# Patient Record
Sex: Male | Born: 1990 | Race: Black or African American | Hispanic: No | Marital: Single | Smoking: Never smoker
Health system: Southern US, Community
[De-identification: ages and names within clinical notes are randomized; demographics above are authoritative.]

---
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
## 2008-07-10 ENCOUNTER — Ambulatory Visit: Payer: Self-pay | Admitting: Family Medicine

## 2009-01-14 ENCOUNTER — Ambulatory Visit: Payer: Self-pay | Admitting: Family Medicine

## 2010-12-06 NOTE — Assessment & Plan Note (Signed)
Summary: cpe/eo   Vital Signs:  Patient Profile:   20 Years Old Male Height:     68 inches (172.72 cm) Weight:      137.5 pounds (62.50 kg) BMI:     20.98 Temp:     98.2 degrees F (36.78 degrees C) oral Pulse rate:   70 / minute BP sitting:   134 / 70  (left arm) Cuff size:   regular  Pt. in pain?   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 He is living in the dorm with 1 roommate. He says they get along well. He does see his mom often as she lives in the area. He is very active; going to the gym and running often. He is enjoying his schooling. His girlfriend moved away. He is not sexually active. He denies drinking, smoking and drugs. He did need some paperwork filled out for his school and for the state of [place]. His mother is going to become a [employer] for children and he is going to be visiting it so the state requires that he have a health exam. This paperwork was filled out except the TB test because he can not come back in 3 days to get it read. He has agreed to find out if his school can place a PPD test and if not, to come back next week.  He has no health issues at this time and no concerns or questions.     Past Medical History:    childhood asthma  Past Surgical History:    none   Family History:    Mgma-diabetes, Pgma-diabetes,HTN  Social History:    Lives with mom, sister at college, no pets    Ran indoor track, goes to the [employer] and runs.    Risk Factors:  Tobacco use:  never   Review of Systems       neg except as noted in HPI   Physical  Exam  General:     Well-developed,well-nourished,in no acute distress; alert,appropriate and cooperative throughout examination Head:     Normocephalic and atraumatic without obvious abnormalities. No apparent alopecia or balding. Eyes:     No corneal or conjunctival inflammation noted. EOMI. Perrla. Funduscopic exam benign, without hemorrhages, exudates or papilledema. Vision grossly normal. Pt is wearing one contact because the other one was irritating him.  Ears:     External ear exam shows no significant lesions or deformities.  Otoscopic examination reveals clear canals, tympanic membranes are intact bilaterally without bulging, retraction, inflammation or discharge. Hearing is grossly normal bilaterally. Nose:     External nasal examination shows no deformity or inflammation. Nasal mucosa are pink and moist without lesions or exudates. Mouth:     Oral mucosa and oropharynx without lesions or exudates.  one tooth is missing. plaque is present. advised to see dentist. Neck:     No deformities, masses, or tenderness noted. Chest Wall:     No deformities, masses,  tenderness or gynecomastia noted. Lungs:     Normal respiratory effort, chest expands symmetrically. Lungs are clear to auscultation, no crackles or wheezes. Heart:     Normal rate and regular rhythm. S1 and S2 normal without gallop, murmur, click, rub or other extra sounds. Abdomen:     Bowel sounds positive,abdomen soft and non-tender without masses, organomegaly or hernias noted. Genitalia:     Testes bilaterally descended without nodularity, tenderness or masses. No scrotal masses or lesions. No penis lesions or urethral discharge. Msk:     No deformity or scoliosis noted of thoracic or lumbar spine.  no muscle atrophy.  excellent strength in all extremeties.  Extremities:     No clubbing, cyanosis, edema, or deformity noted with normal full range of motion of all joints.   Neurologic:     No cranial nerve deficits  noted. Station and gait are normal. DTRs are symmetrical throughout. Sensory, motor and coordinative functions appear intact. Skin:     Intact without suspicious lesions or rashes Psych:     Cognition and judgment appear intact. Alert and cooperative with normal attention span and concentration. No apparent delusions, illusions, hallucinations    Impression & Recommendations:  Problem # 1:  PHYSICAL EXAMINATION, NORMAL (ICD-V70.0) Assessment: Unchanged I spent >30 mintues of face to face time with the patient. The patient had two health forms to fill out for school and for the state.  He is going to try to get a PPD placed at his school. If they will not place it, he is advised to return to our clinic and get a PPD placed so that he can have all his paperwork filled out.  His health is great and his physical exam showed a healthy, 20 yo male with no health problems.   Orders: [facility]- Est  Level 4 (16109)    Patient Instructions: 1)  You had a complete physical today. It was completely normal. 2)  You appear to be in very good health. Continue to abstain from alcohol, drugs and smoking. Continue to exercise at the gym. Continue to eat as healthfully as possible (1/2 plate vegetables, 1/4 plate protein, 1/4 plate carbs).  3)  Your paper is mostly filled out. Please look into getting a PPD test placed at your school's health clinic. If you can't, come back next week and we will place it on Tuesday.  4)    5)  Please schedule a follow-up appointment as needed.   ]  Vital Signs:  Patient Profile:   20 Years Old Male Height:     68 inches (172.72 cm) Weight:      137.5 pounds (62.50 kg) BMI:     20.98 Temp:     98.2 degrees F (36.78 degrees C) oral Pulse rate:   70 / minute BP sitting:   134 / 70 Cuff size:   regular              Vision Screening: Right Eye w/o correction: 20 / 100 Left eye with correction: 20 / 25 Both eyes with correction: 20 / 25

## 2010-12-06 NOTE — Assessment & Plan Note (Signed)
Summary: knot on chest,df   Vital Signs:  Patient profile:   20 year old male Height:      68 inches Weight:      137.3 pounds BMI:     20.95 Temp:     97.3 degrees F oral Pulse rate:   79 / minute BP sitting:   122 / 85  (left arm) Cuff size:   regular  Vitals Entered By: Dedra Skeens CMA, (January 14, 2009 1:41 PM) CC: chest/breast lump Is Patient Diabetic? No Pain Assessment Patient in pain? 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 It was painless. It has regressed over time and is not present at the visit today. He has not had any chest pain and no shortness of breath. He has noticed no enlargement in his axilla.   Habits & Providers     Tobacco Status: never  Review of Systems       see HPI  Physical Exam  General:  Thin, in no acute distress; alert,appropriate and cooperative throughout examination Chest Wall:  no deformities, no tenderness, no masses, and no gynecomastia.  NO axillary lymphnode enlargement.  Breasts:  no lump under Rt nipple. Both chest/breast are symmetric and no masses, adenopathy, or gynecomastia present. no nipple discharge, no tenderness, no inflammation, and no skin changes.   Lungs:  Normal respiratory effort, chest expands symmetrically. Lungs are clear to auscultation, no crackles or wheezes. Heart:  Normal rate and regular rhythm. S1 and S2 normal without gallop, murmur, click, rub or other extra sounds.   Impression & Recommendations:  Problem # 1:  Sx of SWELLING, MASS, OR LUMP IN CHEST (ICD-786.6) Assessment New Pt had no lump or swelling under the Rt nipple when examined. Discussed the possibility of breast cancer in men and that it was wise to come in. Pt encouraged to come in again if mass reappears. Plan to see the pt back when he has any concerns.   Orders: [facility]- Est Level  3 (35009)  Patient  Instructions: 1)  It was good to see you. 2)  I do not feel a lump. I think you are doing well. 3)  If you feel a lump on your chest again, please come back in and I will take a look at it.  4)  Have a great day. 5)  The medication list was reviewed and reconciled.  All changed / newly prescribed medications were explained.  A complete medication list was provided to the patient / caregiver.

## 2015-11-21 ENCOUNTER — Ambulatory Visit (INDEPENDENT_AMBULATORY_CARE_PROVIDER_SITE_OTHER): Payer: BLUE CROSS/BLUE SHIELD | Admitting: Family Medicine

## 2015-11-21 VITALS — BP 120/72 | HR 65 | Temp 98.0°F | Resp 18 | Ht 67.5 in | Wt 159.0 lb

## 2015-11-21 DIAGNOSIS — R21 Rash and other nonspecific skin eruption: Secondary | ICD-10-CM

## 2015-11-21 MED ORDER — CLOTRIMAZOLE-BETAMETHASONE 1-0.05 % EX CREA: 1.0000 "application " | TOPICAL_CREAM | Freq: Two times a day (BID) | CUTANEOUS | Status: AC

## 2015-11-21 MED ORDER — DOXYCYCLINE HYCLATE 100 MG PO TABS: 100.0000 mg | ORAL_TABLET | Freq: Two times a day (BID) | ORAL | Status: DC

## 2015-11-21 NOTE — Progress Notes (Signed)
This chart was scribed for Elvina Sidle, MD by [facility], medical scribe at [facility].The patient was seen in exam room 09 and the patient's care was started at 10:17 AM.  Patient ID: DYLANN GALLIER MRN: [id number], DOB: [birth year], 25 y.o. Date of Encounter: 11/21/2015  Primary Physician: No primary care provider on file.  Chief Complaint:  Chief Complaint  Patient presents with   Rash    face, neck, arms and gluteal area since  monday    HPI:  TOD ABRAHAMSEN is a 25 y.o. male who presents to [facility] because of a rash on his face, neck, buttock and bilateral arms. The rash does itch. He has used benadryl cream for relief. He did change his detergent. Exercises frequently at the gym. No abdominal pain, cough or fever. Works for a [employer].  History reviewed. No pertinent past medical history.   Home Meds: Prior to Admission medications   Medication Sig Start Date End Date Taking? Authorizing Provider  diphenhydrAMINE (BENADRYL) 25 mg capsule Take 25 mg by mouth every 6 (six) hours as needed.   Yes Historical Provider, MD  diphenhydrAMINE-zinc acetate (BENADRYL) cream Apply topically 3 (three) times daily as needed for itching.   Yes Historical Provider, MD   Allergies: No Known Allergies  Social History   Social History   Marital Status: Single    Spouse Name: N/A   Number of Children: N/A   Years of Education: N/A   Occupational History   Not on file.   Social History Main Topics   Smoking status: Never Smoker    Smokeless tobacco: Not on file   Alcohol Use: No   Drug Use: No   Sexual Activity: Not on file   Other Topics Concern   Not on file   Social History Narrative   No narrative on file    Review of Systems: Constitutional: negative for chills, fever, night sweats, weight changes, or fatigue  HEENT: negative for vision changes, hearing loss, congestion, rhinorrhea, ST, epistaxis, or  sinus pressure Cardiovascular: negative for chest pain or palpitations Respiratory: negative for hemoptysis, wheezing, shortness of breath, or cough Abdominal: negative for abdominal pain, nausea, vomiting, diarrhea, or constipation Dermatological: positive for rash. Neurologic: negative for headache, dizziness, or syncope All other systems reviewed and are otherwise negative with the exception to those above and in the HPI.  Physical Exam: Blood pressure 120/72, pulse 65, temperature 98 F (36.7 C), temperature source Oral, resp. rate 18, height 5' 7.5" (1.715 m), weight 159 lb (72.122 kg), SpO2 98 %., Body mass index is 24.52 kg/(m^2). General: Well developed, well nourished, in no acute distress. Head: Normocephalic, atraumatic, eyes without discharge, sclera non-icteric, nares are without discharge. Bilateral auditory canals clear, TM's are without perforation, pearly grey and translucent with reflective cone of light bilaterally. Oral cavity moist, posterior pharynx without exudate, erythema, peritonsillar abscess, or post nasal drip.  Neck: Supple. No thyromegaly. Full ROM. No lymphadenopathy. Lungs: Clear bilaterally to auscultation without wheezes, rales, or rhonchi. Breathing is unlabored. Heart: RRR with S1 S2. No murmurs, rubs, or gallops appreciated. Abdomen: Soft, non-tender, non-distended with normoactive bowel sounds. No hepatomegaly. No rebound/guarding. No obvious abdominal masses. Msk:  Strength and tone normal for age. Extremities/Skin: Warm and dry. No clubbing or cyanosis. No edema. Acne form like rash, multiple fine papules. Patches of hyperpigmented papules on his arms and right buttock. Neuro: Alert and oriented X 3. Moves all extremities spontaneously.  Gait is normal. CNII-XII grossly in tact. Psych:  Responds to questions appropriately with a normal affect.   Labs:  ASSESSMENT AND PLAN:  25 y.o. year old male with   By signing my name below, I, Nadim Abuhashem,  attest that this documentation has been prepared under the direction and in the presence of Elvina SidleKurt Lauenstein, MD.  Electronically Signed: Conchita ParisNadim Abuhashem, medical scribe. 11/21/2015 10:28 AM.  This chart was scribed in my presence and reviewed by me personally.    ICD-9-CM ICD-10-CM   1. Rash and nonspecific skin eruption 782.1 R21 doxycycline (VIBRA-TABS) 100 MG tablet     clotrimazole-betamethasone (LOTRISONE) cream     Signed, Elvina SidleKurt Lauenstein, MD

## 2016-10-06 ENCOUNTER — Ambulatory Visit (INDEPENDENT_AMBULATORY_CARE_PROVIDER_SITE_OTHER): Payer: BLUE CROSS/BLUE SHIELD | Admitting: Physician Assistant

## 2016-10-06 VITALS — BP 120/68 | HR 65 | Temp 98.2°F | Resp 16 | Ht 67.0 in | Wt 140.2 lb

## 2016-10-06 DIAGNOSIS — R21 Rash and other nonspecific skin eruption: Secondary | ICD-10-CM | POA: Diagnosis not present

## 2016-10-06 LAB — POCT SKIN KOH: Skin KOH, POC: NEGATIVE

## 2016-10-06 LAB — GLUCOSE, POCT (MANUAL RESULT ENTRY): POC Glucose: 89 mg/dL (ref 70–99)

## 2016-10-06 MED ORDER — PREDNISONE 20 MG PO TABS: ORAL_TABLET | ORAL | 0 refills | Status: DC

## 2016-10-06 NOTE — Patient Instructions (Addendum)
-  Take prednisone as prescribed.   -We will contact you with lab results.   -Return to clinic if symptoms worsen, do not improve in 2 weeks, or as needed   WOUND CARE . Keep area clean and dry for 24 hours. Do not remove bandage, if applied. . After 24 hours, remove bandage and wash wound gently with mild soap and warm water. Reapply a new bandage after cleaning wound, if directed. . Notify the office if you experience any of the following signs of infection: Swelling, redness, pus drainage, streaking, fever >101.0 F . Notify the office if you experience excessive bleeding that does not stop after 15-20 minutes of constant, firm pressure.

## 2016-10-06 NOTE — Progress Notes (Signed)
Luis AlarGregory D Rod  MRN: [id number] DOB: Mar 25, 1991  Subjective:  Luis Randolph is a 25 y.o. male seen in office today for a chief complaint of rash on neck and bilateral arms x 2 months. Has associated itching. Denies pain or purulent discharge. Pt had a similar rash on bilateral arms in 11/2015 and was given lotrisone cream and doxycycline tablets. Notes the rash completely resolved. Denies any recent change to laundry detergent, lotions, soaps, foods, antibiotics, and plants. He has a history of childhood asthma. No seasonal allergies. No one at home has a similar rash. Has tried a eucerin cream which helped with itching. Pt does go to the gym but has not been recently.    Review of Systems  Constitutional: Negative for chills, diaphoresis, fatigue and fever.  Gastrointestinal: Negative for abdominal pain, blood in stool, diarrhea, nausea and vomiting.  Musculoskeletal: Negative for arthralgias, joint swelling and myalgias.    Patient Active Problem List   Diagnosis Date Noted  . 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 clotrimazole-betamethasone (LOTRISONE) cream Apply 1 application topically 2 (two) times daily. (Patient not taking: Reported on 10/06/2016) 30 g 0  . diphenhydrAMINE (BENADRYL) 25 mg capsule Take 25 mg by mouth every 6 (six) hours as needed.    . diphenhydrAMINE-zinc acetate (BENADRYL) cream Apply topically 3 (three) times daily as needed for itching.     No current facility-administered medications on file prior to visit.     No Known Allergies   Objective:  BP 120/68 (BP Location: Right Arm, Patient Position: Sitting, Cuff Size: Normal)   Pulse 65   Temp 98.2 F (36.8 C) (Oral)   Resp 16   Ht 5\' 7"  (1.702 m)   Wt 140 lb 3.2 oz (63.6 kg)   SpO2 98%   BMI 21.96 kg/m   Physical Exam  Constitutional: He is oriented to person, place, and time and well-developed,  well-nourished, and in no distress.  HENT:  Head: Normocephalic and atraumatic.  Eyes: Conjunctivae are normal.  Neck: Normal range of motion.  Pulmonary/Chest: Effort normal.  Neurological: He is alert and oriented to person, place, and time. Gait normal.  Skin: Skin is warm and dry.  Large hyperpigmented patches with interspersed papules on bilateral neck and left lateral aspect of antecubital fossa.   Psychiatric: Affect normal.  Vitals reviewed.   Results for orders placed or performed in visit on 10/06/16 (from the past 24 hour(s))  POCT Skin KOH     Status: None   Collection Time: 10/06/16  6:40 PM  Result Value Ref Range   Skin KOH, POC Negative   POCT glucose (manual entry)     Status: Normal   Collection Time: 10/06/16  7:23 PM  Result Value Ref Range   POC Glucose 89 70 - 99 mg/dl    Procedure Note:  Area prepped with betadine. Local anesthesia with 1% lidocaine with epinephrine used. Sterile drape applied.  Punch biopsy obtained. #1 horizontal mattress suture placed using 4-0 absorbable Vicryl. Mupiricin ointment and dressing applied. Specimen collected and sent for dermatology pathology.    Assessment and Plan :  1. Rash and nonspecific skin eruption Await results -Wound care instructions given   - POCT Skin KOH - POCT glucose (manual entry) - Dermatology pathology - predniSONE (DELTASONE) 20 MG tablet; Take 3 PO QAM x3days, 2 PO QAM x3days, 1 PO QAM x3days  Dispense: 18 tablet;  Refill: 0 -Return to clinic if symptoms worsen, do not improve in 2 weeks, or as needed   Benjiman CoreBrittany Makeyla Govan PA-C  [facility] 10/06/2016 8:49 PM

## 2017-03-12 ENCOUNTER — Ambulatory Visit (INDEPENDENT_AMBULATORY_CARE_PROVIDER_SITE_OTHER): Payer: BLUE CROSS/BLUE SHIELD | Admitting: Emergency Medicine

## 2017-03-12 VITALS — BP 146/89 | HR 65 | Temp 98.6°F | Resp 18 | Ht 67.0 in | Wt 153.6 lb

## 2017-03-12 DIAGNOSIS — R21 Rash and other nonspecific skin eruption: Secondary | ICD-10-CM | POA: Diagnosis not present

## 2017-03-12 MED ORDER — PREDNISONE 20 MG PO TABS: ORAL_TABLET | ORAL | 0 refills | Status: AC

## 2017-03-12 MED ORDER — TRIAMCINOLONE ACETONIDE 0.1 % EX CREA: 1.0000 "application " | TOPICAL_CREAM | Freq: Two times a day (BID) | CUTANEOUS | 2 refills | Status: AC

## 2017-03-12 NOTE — Progress Notes (Signed)
Luis Randolph 26 y.o.   Chief Complaint  Patient presents with  . Follow-up    on rash which is on both arms and neck    HISTORY OF PRESENT ILLNESS: This is a 26 y.o. male complaining of recurrent rash mostly to neck area and outer elbows. Medications help but rash keeps coming back.  HPI   Prior to Admission medications   Medication Sig Start Date End Date Taking? Authorizing Provider  clotrimazole-betamethasone (LOTRISONE) cream Apply 1 application topically 2 (two) times daily. Patient not taking: Reported on 10/06/2016 11/21/15   Elvina Sidle, MD  diphenhydrAMINE (BENADRYL) 25 mg capsule Take 25 mg by mouth every 6 (six) hours as needed.    [provider]  diphenhydrAMINE-zinc acetate (BENADRYL) cream Apply topically 3 (three) times daily as needed for itching.    [provider]  predniSONE (DELTASONE) 20 MG tablet Take 3 PO QAM x3days, 2 PO QAM x3days, 1 PO QAM x3days Patient not taking: Reported on 03/12/2017 10/06/16   Benjiman Core D, PA-C    No Known Allergies  Patient Active Problem List   Diagnosis Date Noted  . 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  No past surgical history on file.  Social History   Social History  . Marital status: Single    Spouse name: N/A  . Number of children: N/A  . Years of education: N/A   Occupational History  . Not on file.   Social History Main Topics  . Smoking status: Never Smoker  . Smokeless tobacco: Not on file  . Alcohol use No  . Drug use: No  . Sexual activity: Not on file   Other Topics Concern  . Not on file   Social History Narrative  . No narrative on file    Family History  Problem Relation Age of Onset  . Hyperlipidemia Mother      Review of Systems  Constitutional: Negative for chills, fever and weight loss.  HENT: Negative.  Negative for congestion, nosebleeds and sore throat.   Eyes: Negative.  Negative for discharge and  redness.  Respiratory: Negative.  Negative for cough and shortness of breath.   Cardiovascular: Negative.  Negative for chest pain and palpitations.  Gastrointestinal: Negative for abdominal pain, diarrhea, nausea and vomiting.  Genitourinary: Negative for dysuria and hematuria.  Musculoskeletal: Negative.  Negative for back pain, myalgias and neck pain.  Skin: Positive for rash.  Neurological: Negative.  Negative for dizziness and headaches.  Endo/Heme/Allergies: Negative.   All other systems reviewed and are negative.    Vitals:   03/12/17 1716 03/12/17 1748  BP: (!) 153/81 (!) 146/89  Pulse: 65   Resp: 18   Temp: 98.6 F (37 C)      Physical Exam  Constitutional: He is oriented to person, place, and time. He appears well-developed and well-nourished.  HENT:  Head: Normocephalic and atraumatic.  Mouth/Throat: Oropharynx is clear and moist.  Eyes: Conjunctivae and EOM are normal. Pupils are equal, round, and reactive to light.  Neck: Normal range of motion. Neck supple. No JVD present. No thyromegaly present.  Cardiovascular: Normal rate, regular rhythm and normal heart sounds.   Pulmonary/Chest: Effort normal and breath sounds normal.  Abdominal: Soft. Bowel sounds are normal. He exhibits no distension. There is no tenderness.  Musculoskeletal: Normal range of motion.  Lymphadenopathy:    He has no cervical adenopathy.  Neurological: He is alert and oriented to person, place, and  time. No sensory deficit. He exhibits normal muscle tone.  Skin: Skin is warm and dry. Capillary refill takes less than 2 seconds. Rash (eczematous rash to periphery of lower neck and also both outer antecubital areas) noted.  Psychiatric: He has a normal mood and affect. His behavior is normal.  Vitals reviewed.    ASSESSMENT & PLAN: Luis Randolph was seen today for follow-up.  Diagnoses and all orders for this visit:  Rash and nonspecific skin eruption -     CBC with Differential/Platelet -      Comprehensive metabolic panel -     IGE -     Ambulatory referral to Dermatology -     predniSONE (DELTASONE) 20 MG tablet; Take 3 PO QAM x3days, 2 PO QAM x3days, 1 PO QAM x3days  Other orders -     triamcinolone cream (KENALOG) 0.1 %; Apply 1 application topically 2 (two) times daily.    Patient Instructions       IF you received an x-ray today, you will receive an invoice from [facility]. Please contact [facility] at [phone] with questions or concerns regarding your invoice.   IF you received labwork today, you will receive an invoice from [facility]. Please contact [facility] at [phone] with questions or concerns regarding your invoice.   Our billing staff will not be able to assist you with questions regarding bills from these companies.  You will be contacted with the lab results as soon as they are available. The fastest way to get your results is to activate your My Chart account. Instructions are located on the last page of this paperwork. If you have not heard from us regarding the results in 2 weeks, please contact this office.      Contact Dermatitis Dermatitis is redness, soreness, and swelling (inflammation) of the skin. Contact dermatitis is a reaction to certain substances that touch the skin. You either touched something that irritated your skin, or you have allergies to something you touched. Follow these instructions at home: Skin Care   Moisturize your skin as needed.  Apply cool compresses to the affected areas.  Try taking a bath with:  Epsom salts. Follow the instructions on the package. You can get these at a pharmacy or grocery store.  Baking soda. Pour a small amount into the bath as told by your doctor.  Colloidal oatmeal. Follow the instructions on the package. You can get this at a pharmacy or grocery store.  Try applying baking soda paste to your skin. Stir water into baking soda until it looks like paste.  Do not  scratch your skin.  Bathe less often.  Bathe in lukewarm water. Avoid using hot water. Medicines   Take or apply over-the-counter and prescription medicines only as told by your doctor.  If you were prescribed an antibiotic medicine, take or apply your antibiotic as told by your doctor. Do not stop taking the antibiotic even if your condition starts to get better. General instructions   Keep all follow-up visits as told by your doctor. This is important.  Avoid the substance that caused your reaction. If you do not know what caused it, keep a journal to try to track what caused it. Write down:  What you eat.  What cosmetic products you use.  What you drink.  What you wear in the affected area. This includes jewelry.  If you were given a bandage (dressing), take care of it as told by your doctor. This includes when to change and  remove it. Contact a doctor if:  You do not get better with treatment.  Your condition gets worse.  You have signs of infection such as:  Swelling.  Tenderness.  Redness.  Soreness.  Warmth.  You have a fever.  You have new symptoms. Get help right away if:  You have a very bad headache.  You have neck pain.  Your neck is stiff.  You throw up (vomit).  You feel very sleepy.  You see red streaks coming from the affected area.  Your bone or joint underneath the affected area becomes painful after the skin has healed.  The affected area turns darker.  You have trouble breathing. This information is not intended to replace advice given to you by your health care provider. Make sure you discuss any questions you have with your health care provider. Document Released: 08/20/2009 Document Revised: 03/30/2016 Document Reviewed: 03/10/2015 Elsevier Interactive Patient Education  2017 Elsevier Inc.      Edwina Barth, MD [facility]

## 2017-03-12 NOTE — Patient Instructions (Addendum)
     IF you received an x-ray today, you will receive an invoice from [facility]. Please contact [facility] at [phone] with questions or concerns regarding your invoice.   IF you received labwork today, you will receive an invoice from [facility]. Please contact [facility] at [phone] with questions or concerns regarding your invoice.   Our billing staff will not be able to assist you with questions regarding bills from these companies.  You will be contacted with the lab results as soon as they are available. The fastest way to get your results is to activate your My Chart account. Instructions are located on the last page of this paperwork. If you have not heard from us regarding the results in 2 weeks, please contact this office.      Contact Dermatitis Dermatitis is redness, soreness, and swelling (inflammation) of the skin. Contact dermatitis is a reaction to certain substances that touch the skin. You either touched something that irritated your skin, or you have allergies to something you touched. Follow these instructions at home: Skin Care   Moisturize your skin as needed.  Apply cool compresses to the affected areas.  Try taking a bath with:  Epsom salts. Follow the instructions on the package. You can get these at a pharmacy or grocery store.  Baking soda. Pour a small amount into the bath as told by your doctor.  Colloidal oatmeal. Follow the instructions on the package. You can get this at a pharmacy or grocery store.  Try applying baking soda paste to your skin. Stir water into baking soda until it looks like paste.  Do not scratch your skin.  Bathe less often.  Bathe in lukewarm water. Avoid using hot water. Medicines   Take or apply over-the-counter and prescription medicines only as told by your doctor.  If you were prescribed an antibiotic medicine, take or apply your antibiotic as told by your doctor. Do not stop taking the  antibiotic even if your condition starts to get better. General instructions   Keep all follow-up visits as told by your doctor. This is important.  Avoid the substance that caused your reaction. If you do not know what caused it, keep a journal to try to track what caused it. Write down:  What you eat.  What cosmetic products you use.  What you drink.  What you wear in the affected area. This includes jewelry.  If you were given a bandage (dressing), take care of it as told by your doctor. This includes when to change and remove it. Contact a doctor if:  You do not get better with treatment.  Your condition gets worse.  You have signs of infection such as:  Swelling.  Tenderness.  Redness.  Soreness.  Warmth.  You have a fever.  You have new symptoms. Get help right away if:  You have a very bad headache.  You have neck pain.  Your neck is stiff.  You throw up (vomit).  You feel very sleepy.  You see red streaks coming from the affected area.  Your bone or joint underneath the affected area becomes painful after the skin has healed.  The affected area turns darker.  You have trouble breathing. This information is not intended to replace advice given to you by your health care provider. Make sure you discuss any questions you have with your health care provider. Document Released: 08/20/2009 Document Revised: 03/30/2016 Document Reviewed: 03/10/2015 Elsevier Interactive Patient Education  2017 Elsevier Inc.  

## 2017-03-15 LAB — COMPREHENSIVE METABOLIC PANEL
ALT: 51 IU/L — ABNORMAL HIGH (ref 0–44)
AST: 119 IU/L — ABNORMAL HIGH (ref 0–40)
Albumin/Globulin Ratio: 1.5 (ref 1.2–2.2)
Albumin: 4.4 g/dL (ref 3.5–5.5)
Alkaline Phosphatase: 64 IU/L (ref 39–117)
BUN/Creatinine Ratio: 21 — ABNORMAL HIGH (ref 9–20)
BUN: 22 mg/dL — ABNORMAL HIGH (ref 6–20)
Bilirubin Total: 0.4 mg/dL (ref 0.0–1.2)
CO2: 23 mmol/L (ref 18–29)
Calcium: 9.3 mg/dL (ref 8.7–10.2)
Chloride: 98 mmol/L (ref 96–106)
Creatinine, Ser: 1.03 mg/dL (ref 0.76–1.27)
GFR calc Af Amer: 115 mL/min/{1.73_m2} (ref >59)
GFR calc non Af Amer: 99 mL/min/{1.73_m2} (ref >59)
Globulin, Total: 3 g/dL (ref 1.5–4.5)
Glucose: 116 mg/dL — ABNORMAL HIGH (ref 65–99)
Potassium: 4 mmol/L (ref 3.5–5.2)
Sodium: 138 mmol/L (ref 134–144)
Total Protein: 7.4 g/dL (ref 6.0–8.5)

## 2017-03-15 LAB — CBC WITH DIFFERENTIAL/PLATELET
Basophils Absolute: 0 10*3/uL (ref 0.0–0.2)
Basos: 0 %
EOS (ABSOLUTE): 0.3 10*3/uL (ref 0.0–0.4)
Eos: 5 %
Hematocrit: 40.2 % (ref 37.5–51.0)
Hemoglobin: 13.7 g/dL (ref 13.0–17.7)
Immature Grans (Abs): 0 10*3/uL (ref 0.0–0.1)
Immature Granulocytes: 0 %
Lymphocytes Absolute: 2 10*3/uL (ref 0.7–3.1)
Lymphs: 34 %
MCH: 27.2 pg (ref 26.6–33.0)
MCHC: 34.1 g/dL (ref 31.5–35.7)
MCV: 80 fL (ref 79–97)
Monocytes Absolute: 0.4 10*3/uL (ref 0.1–0.9)
Monocytes: 7 %
Neutrophils Absolute: 3.2 10*3/uL (ref 1.4–7.0)
Neutrophils: 54 %
Platelets: 187 10*3/uL (ref 150–379)
RBC: 5.04 x10E6/uL (ref 4.14–5.80)
RDW: 13.8 % (ref 12.3–15.4)
WBC: 6 10*3/uL (ref 3.4–10.8)

## 2017-03-15 LAB — IGE: IgE (Immunoglobulin E), Serum: 633 IU/mL — ABNORMAL HIGH (ref 0–100)

## 2017-10-09 ENCOUNTER — Ambulatory Visit: Payer: BLUE CROSS/BLUE SHIELD | Admitting: Emergency Medicine

## 2017-10-09 ENCOUNTER — Encounter: Payer: Self-pay | Admitting: Emergency Medicine

## 2017-10-09 ENCOUNTER — Telehealth: Payer: Self-pay | Admitting: *Deleted

## 2017-10-09 VITALS — BP 120/70 | HR 59 | Temp 98.2°F | Resp 16 | Ht 67.5 in | Wt 154.4 lb

## 2017-10-09 DIAGNOSIS — R21 Rash and other nonspecific skin eruption: Secondary | ICD-10-CM | POA: Diagnosis not present

## 2017-10-09 MED ORDER — PREDNISONE 20 MG PO TABS: 40.0000 mg | ORAL_TABLET | Freq: Every day | ORAL | 0 refills | Status: AC

## 2017-10-09 MED ORDER — TRIAMCINOLONE ACETONIDE 0.1 % EX CREA: 1.0000 "application " | TOPICAL_CREAM | Freq: Two times a day (BID) | CUTANEOUS | 0 refills | Status: AC

## 2017-10-09 NOTE — Progress Notes (Signed)
Luis Randolph 26 y.o.   Chief Complaint  Patient presents with  . Rash    neck  x 1 1/2 months with itching    HISTORY OF PRESENT ILLNESS: This is a 26 y.o. male complaining of recurrent rash to both sides of neck. Medication makes it better; seen by with same last May and referred to Dermatology but never saw one.  HPI   Prior to Admission medications   Medication Sig Start Date End Date Taking? Authorizing Provider  clotrimazole-betamethasone (LOTRISONE) cream Apply 1 application topically 2 (two) times daily. Patient not taking: Reported on 10/06/2016 11/21/15   Elvina Sidle, MD  diphenhydrAMINE (BENADRYL) 25 mg capsule Take 25 mg by mouth every 6 (six) hours as needed.    [provider]  diphenhydrAMINE-zinc acetate (BENADRYL) cream Apply topically 3 (three) times daily as needed for itching.    [provider]  predniSONE (DELTASONE) 20 MG tablet Take 3 PO QAM x3days, 2 PO QAM x3days, 1 PO QAM x3days Patient not taking: Reported on 10/09/2017 03/12/17   Georgina Quint, MD  predniSONE (DELTASONE) 20 MG tablet Take 2 tablets (40 mg total) by mouth daily with breakfast for 5 days. 10/09/17 10/14/17  Georgina Quint, MD  triamcinolone cream (KENALOG) 0.1 % Apply 1 application topically 2 (two) times daily. Patient not taking: Reported on 10/09/2017 03/12/17   Georgina Quint, MD  triamcinolone cream (KENALOG) 0.1 % Apply 1 application topically 2 (two) times daily. 10/09/17   Georgina Quint, MD    No Known Allergies  Patient Active Problem List   Diagnosis Date Noted  . Rash and nonspecific skin eruption 03/12/2017  . 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  No past surgical history on file.  Social History   Socioeconomic History  . Marital status: Single    Spouse name: Not on file  . Number of children: Not on file  . Years of education: Not on file  . Highest education level: Not on  file  Social Needs  . Financial resource strain: Not on file  . Food insecurity - worry: Not on file  . Food insecurity - inability: Not on file  . Transportation needs - medical: Not on file  . Transportation needs - non-medical: Not on file  Occupational History  . Not on file  Tobacco Use  . Smoking status: Never Smoker  . Smokeless tobacco: Never Used  Substance and Sexual Activity  . Alcohol use: No    Alcohol/week: 0.0 oz  . Drug use: No  . Sexual activity: Not on file  Other Topics Concern  . Not on file  Social History Narrative  . Not on file    Family History  Problem Relation Age of Onset  . Hyperlipidemia Mother      Review of Systems  Constitutional: Negative.  Negative for chills and fever.  HENT: Negative.  Negative for sore throat.   Eyes: Negative for discharge and redness.  Respiratory: Negative for cough and shortness of breath.   Cardiovascular: Negative for chest pain and palpitations.  Gastrointestinal: Negative for abdominal pain, diarrhea, nausea and vomiting.  Genitourinary: Negative for dysuria and hematuria.  Musculoskeletal: Negative for myalgias.  Skin: Positive for rash.  Neurological: Negative for dizziness and headaches.  Endo/Heme/Allergies: Negative.    Vitals:   10/09/17 1701  BP: 120/70  Pulse: (!) 59  Resp: 16  Temp: 98.2 F (36.8 C)  SpO2: 99%  Physical Exam  Constitutional: He is oriented to person, place, and time. He appears well-developed and well-nourished.  HENT:  Head: Normocephalic and atraumatic.  Nose: Nose normal.  Mouth/Throat: Oropharynx is clear and moist.  Eyes: Pupils are equal, round, and reactive to light.  Neck: Normal range of motion. Neck supple.  Cardiovascular: Normal rate and regular rhythm.  Pulmonary/Chest: Effort normal.  Musculoskeletal: Normal range of motion.  Neurological: He is alert and oriented to person, place, and time.  Skin: Rash noted.  Eczematous dry rash at side of neck  bilaterally extending down into trapezius areas.  Psychiatric: He has a normal mood and affect. His behavior is normal.  Vitals reviewed.    ASSESSMENT & PLAN: Luis LitesGregory was seen today for rash.  Diagnoses and all orders for this visit:  Rash and nonspecific skin eruption -     Ambulatory referral to Dermatology -     predniSONE (DELTASONE) 20 MG tablet; Take 2 tablets (40 mg total) by mouth daily with breakfast for 5 days. -     triamcinolone cream (KENALOG) 0.1 %; Apply 1 application topically 2 (two) times daily. -     CBC with Differential/Platelet -     Comprehensive metabolic panel    Patient Instructions       IF you received an x-ray today, you will receive an invoice from [facility]. Please contact [facility] at [phone] with questions or concerns regarding your invoice.   IF you received labwork today, you will receive an invoice from [facility]. Please contact [facility] at [phone] with questions or concerns regarding your invoice.   Our billing staff will not be able to assist you with questions regarding bills from these companies.  You will be contacted with the lab results as soon as they are available. The fastest way to get your results is to activate your My Chart account. Instructions are located on the last page of this paperwork. If you have not heard from us regarding the results in 2 weeks, please contact this office.     Rash A rash is a change in the color of the skin. A rash can also change the way your skin feels. There are many different conditions and factors that can cause a rash. Follow these instructions at home: Pay attention to any changes in your symptoms. Follow these instructions to help with your condition: Medicine Take or apply over-the-counter and prescription medicines only as told by your doctor. These may include:  Corticosteroid cream.  Anti-itch lotions.  Oral antihistamines.  Skin Care  Put cool  compresses on the affected areas.  Try taking a bath with: ? Epsom salts. Follow the instructions on the packaging. You can get these at your local pharmacy or grocery store. ? Baking soda. Pour a small amount into the bath as told by your doctor. ? Colloidal oatmeal. Follow the instructions on the packaging. You can get this at your local pharmacy or grocery store.  Try putting baking soda paste onto your skin. Stir water into baking soda until it gets like a paste.  Do not scratch or rub your skin.  Avoid covering the rash. Make sure the rash is exposed to air as much as possible. General instructions  Avoid hot showers or baths, which can make itching worse. A cold shower may help.  Avoid scented soaps, detergents, and perfumes. Use gentle soaps, detergents, perfumes, and other cosmetic products.  Avoid anything that causes your rash. Keep a journal to help track what  causes your rash. Write down: ? What you eat. ? What cosmetic products you use. ? What you drink. ? What you wear. This includes jewelry.  Keep all follow-up visits as told by your doctor. This is important. Contact a doctor if:  You sweat at night.  You lose weight.  You pee (urinate) more than normal.  You feel weak.  You throw up (vomit).  Your skin or the whites of your eyes look yellow (jaundice).  Your skin: ? Tingles. ? Is numb.  Your rash: ? Does not go away after a few days. ? Gets worse.  You are: ? More thirsty than normal. ? More tired than normal.  You have: ? New symptoms. ? Pain in your belly (abdomen). ? A fever. ? Watery poop (diarrhea). Get help right away if:  Your rash covers all or most of your body. The rash may or may not be painful.  You have blisters that: ? Are on top of the rash. ? Grow larger. ? Grow together. ? Are painful. ? Are inside your nose or mouth.  You have a rash that: ? Looks like purple pinprick-sized spots all over your body. ? Has a  "bull's eye" or looks like a target. ? Is red and painful, causes your skin to peel, and is not from being in the sun too long. This information is not intended to replace advice given to you by your health care provider. Make sure you discuss any questions you have with your health care provider. Document Released: 04/10/2008 Document Revised: 03/30/2016 Document Reviewed: 03/10/2015 Elsevier Interactive Patient Education  2018 Elsevier Inc.      Edwina BarthMiguel Jermond Burkemper, MD [facility]

## 2017-10-09 NOTE — Patient Instructions (Addendum)
   IF you received an x-ray today, you will receive an invoice from [facility]. Please contact [facility] at [phone] with questions or concerns regarding your invoice.   IF you received labwork today, you will receive an invoice from [facility]. Please contact [facility] at [phone] with questions or concerns regarding your invoice.   Our billing staff will not be able to assist you with questions regarding bills from these companies.  You will be contacted with the lab results as soon as they are available. The fastest way to get your results is to activate your My Chart account. Instructions are located on the last page of this paperwork. If you have not heard from us regarding the results in 2 weeks, please contact this office.     Rash A rash is a change in the color of the skin. A rash can also change the way your skin feels. There are many different conditions and factors that can cause a rash. Follow these instructions at home: Pay attention to any changes in your symptoms. Follow these instructions to help with your condition: Medicine Take or apply over-the-counter and prescription medicines only as told by your doctor. These may include:  Corticosteroid cream.  Anti-itch lotions.  Oral antihistamines.  Skin Care  Put cool compresses on the affected areas.  Try taking a bath with: ? Epsom salts. Follow the instructions on the packaging. You can get these at your local pharmacy or grocery store. ? Baking soda. Pour a small amount into the bath as told by your doctor. ? Colloidal oatmeal. Follow the instructions on the packaging. You can get this at your local pharmacy or grocery store.  Try putting baking soda paste onto your skin. Stir water into baking soda until it gets like a paste.  Do not scratch or rub your skin.  Avoid covering the rash. Make sure the rash is exposed to air as much as possible. General instructions  Avoid hot  showers or baths, which can make itching worse. A cold shower may help.  Avoid scented soaps, detergents, and perfumes. Use gentle soaps, detergents, perfumes, and other cosmetic products.  Avoid anything that causes your rash. Keep a journal to help track what causes your rash. Write down: ? What you eat. ? What cosmetic products you use. ? What you drink. ? What you wear. This includes jewelry.  Keep all follow-up visits as told by your doctor. This is important. Contact a doctor if:  You sweat at night.  You lose weight.  You pee (urinate) more than normal.  You feel weak.  You throw up (vomit).  Your skin or the whites of your eyes look yellow (jaundice).  Your skin: ? Tingles. ? Is numb.  Your rash: ? Does not go away after a few days. ? Gets worse.  You are: ? More thirsty than normal. ? More tired than normal.  You have: ? New symptoms. ? Pain in your belly (abdomen). ? A fever. ? Watery poop (diarrhea). Get help right away if:  Your rash covers all or most of your body. The rash may or may not be painful.  You have blisters that: ? Are on top of the rash. ? Grow larger. ? Grow together. ? Are painful. ? Are inside your nose or mouth.  You have a rash that: ? Looks like purple pinprick-sized spots all over your body. ? Has a "bull's eye" or looks like a target. ? Is red and painful, causes   your skin to peel, and is not from being in the sun too long. This information is not intended to replace advice given to you by your health care provider. Make sure you discuss any questions you have with your health care provider. Document Released: 04/10/2008 Document Revised: 03/30/2016 Document Reviewed: 03/10/2015 Elsevier Interactive Patient Education  2018 Elsevier Inc.  

## 2017-10-09 NOTE — Telephone Encounter (Signed)
Spoke to patient to return to office on 10/10/17 to have labs drawn, Epic released blue dot after he left office. Shannon at check in was advised patient will return tomorrow.

## 2022-06-01 ENCOUNTER — Emergency Department (HOSPITAL_COMMUNITY): Payer: 59

## 2022-06-01 ENCOUNTER — Encounter (HOSPITAL_COMMUNITY): Payer: Self-pay

## 2022-06-01 ENCOUNTER — Emergency Department (HOSPITAL_COMMUNITY)
Admission: EM | Admit: 2022-06-01 | Discharge: 2022-06-01 | Disposition: A | Discharge status: Home / Self Care | Payer: 59 | Attending: Emergency Medicine | Admitting: Emergency Medicine

## 2022-06-01 ENCOUNTER — Ambulatory Visit: Payer: Self-pay | Admitting: Family Medicine

## 2022-06-01 ENCOUNTER — Other Ambulatory Visit: Payer: Self-pay

## 2022-06-01 DIAGNOSIS — S01112A Laceration without foreign body of left eyelid and periocular area, initial encounter: Secondary | ICD-10-CM | POA: Diagnosis not present

## 2022-06-01 DIAGNOSIS — S01412A Laceration without foreign body of left cheek and temporomandibular area, initial encounter: Secondary | ICD-10-CM | POA: Diagnosis not present

## 2022-06-01 DIAGNOSIS — S0993XA Unspecified injury of face, initial encounter: Secondary | ICD-10-CM | POA: Diagnosis present

## 2022-06-01 DIAGNOSIS — S0502XA Injury of conjunctiva and corneal abrasion without foreign body, left eye, initial encounter: Secondary | ICD-10-CM | POA: Insufficient documentation

## 2022-06-01 DIAGNOSIS — S0292XA Unspecified fracture of facial bones, initial encounter for closed fracture: Secondary | ICD-10-CM | POA: Insufficient documentation

## 2022-06-01 DIAGNOSIS — J45909 Unspecified asthma, uncomplicated: Secondary | ICD-10-CM | POA: Insufficient documentation

## 2022-06-01 DIAGNOSIS — Z23 Encounter for immunization: Secondary | ICD-10-CM | POA: Insufficient documentation

## 2022-06-01 DIAGNOSIS — W228XXA Striking against or struck by other objects, initial encounter: Secondary | ICD-10-CM | POA: Diagnosis not present

## 2022-06-01 DIAGNOSIS — S0181XA Laceration without foreign body of other part of head, initial encounter: Secondary | ICD-10-CM

## 2022-06-01 MED ORDER — FLUORESCEIN SODIUM 1 MG OP STRP
1.0000 | ORAL_STRIP | Freq: Once | OPHTHALMIC | Status: AC
  Administered 2022-06-01: 1 via OPHTHALMIC
  Filled 2022-06-01: qty 1

## 2022-06-01 MED ORDER — NAPROXEN 500 MG PO TABS: 500.0000 mg | ORAL_TABLET | Freq: Two times a day (BID) | ORAL | 0 refills | Status: AC

## 2022-06-01 MED ORDER — TETANUS-DIPHTH-ACELL PERTUSSIS 5-2.5-18.5 LF-MCG/0.5 IM SUSY
0.5000 mL | PREFILLED_SYRINGE | Freq: Once | INTRAMUSCULAR | Status: AC
  Administered 2022-06-01: 0.5 mL via INTRAMUSCULAR
  Filled 2022-06-01: qty 0.5

## 2022-06-01 MED ORDER — OXYCODONE-ACETAMINOPHEN 5-325 MG PO TABS: 1.0000 | ORAL_TABLET | Freq: Four times a day (QID) | ORAL | 0 refills | Status: AC | PRN

## 2022-06-01 MED ORDER — OXYCODONE-ACETAMINOPHEN 5-325 MG PO TABS: 1.0000 | ORAL_TABLET | Freq: Four times a day (QID) | ORAL | 0 refills | Status: DC | PRN

## 2022-06-01 MED ORDER — TETRACAINE HCL 0.5 % OP SOLN
2.0000 [drp] | Freq: Once | OPHTHALMIC | Status: AC
  Administered 2022-06-01: 2 [drp] via OPHTHALMIC
  Filled 2022-06-01: qty 4
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
